# Patient Record
Sex: Female | Born: 1995 | Race: Black or African American | Hispanic: No | Marital: Single | Smoking: Never smoker
Health system: Southern US, Community
[De-identification: ages and names within clinical notes are randomized; demographics above are authoritative.]

---

## 2016-12-31 ENCOUNTER — Encounter: Payer: Self-pay | Admitting: Emergency Medicine

## 2016-12-31 ENCOUNTER — Emergency Department: Payer: Self-pay

## 2016-12-31 ENCOUNTER — Emergency Department
Admission: EM | Admit: 2016-12-31 | Discharge: 2016-12-31 | Disposition: A | Discharge status: Home / Self Care | Payer: Self-pay | Attending: Emergency Medicine | Admitting: Emergency Medicine

## 2016-12-31 DIAGNOSIS — IMO0001 Reserved for inherently not codable concepts without codable children: Secondary | ICD-10-CM

## 2016-12-31 DIAGNOSIS — Y929 Unspecified place or not applicable: Secondary | ICD-10-CM | POA: Insufficient documentation

## 2016-12-31 DIAGNOSIS — Y999 Unspecified external cause status: Secondary | ICD-10-CM | POA: Insufficient documentation

## 2016-12-31 DIAGNOSIS — W231XXA Caught, crushed, jammed, or pinched between stationary objects, initial encounter: Secondary | ICD-10-CM | POA: Insufficient documentation

## 2016-12-31 DIAGNOSIS — R52 Pain, unspecified: Secondary | ICD-10-CM

## 2016-12-31 DIAGNOSIS — S63286A Dislocation of proximal interphalangeal joint of right little finger, initial encounter: Secondary | ICD-10-CM | POA: Insufficient documentation

## 2016-12-31 DIAGNOSIS — Y9361 Activity, american tackle football: Secondary | ICD-10-CM | POA: Insufficient documentation

## 2016-12-31 MED ORDER — OXYCODONE-ACETAMINOPHEN 5-325 MG PO TABS
1.0000 | ORAL_TABLET | ORAL | Status: DC | PRN
  Administered 2016-12-31: 1 via ORAL

## 2016-12-31 MED ORDER — BUPIVACAINE HCL (PF) 0.5 % IJ SOLN
INTRAMUSCULAR | Status: AC
  Filled 2016-12-31: qty 30

## 2016-12-31 MED ORDER — OXYCODONE-ACETAMINOPHEN 5-325 MG PO TABS
ORAL_TABLET | ORAL | Status: AC
  Administered 2016-12-31: 1 via ORAL
  Filled 2016-12-31: qty 1

## 2016-12-31 NOTE — ED Provider Notes (Signed)
[facility]-EMERGENCY DEPARTMENT Provider Note   CSN: [id number] Arrival date & time: 12/31/16  1325     History   Chief Complaint Chief Complaint  Patient presents with  . Finger Injury    HPI Melissa Calhoun is a 21 y.o. female presents to the emergency department for evaluation of pain to the right fifth digit. She has a deformity to the right fifth digit after playing football and catching the ball just prior to arrival. She jammed her PIP joint of the right fifth digit. She denies any numbness. No bleeding. Pain is moderate to severe.  HPI  History reviewed. No pertinent past medical history.  There are no active problems to display for this patient.   History reviewed. No pertinent surgical history.  OB History    No data available       Home Medications    Prior to Admission medications   Not on File    Family History No family history on file.  Social History Social History  Substance Use Topics  . Smoking status: Never Smoker  . Smokeless tobacco: Never Used  . Alcohol use No     Allergies   Patient has no known allergies.   Review of Systems Review of Systems  Constitutional: Negative for activity change.  HENT: Negative for congestion.   Eyes: Negative for visual disturbance.  Respiratory: Negative for chest tightness.   Musculoskeletal: Positive for arthralgias. Negative for gait problem.  Neurological: Negative for weakness and numbness.  Psychiatric/Behavioral: Negative for agitation, behavioral problems and confusion.     Physical Exam Updated Vital Signs BP (!) 141/83 (BP Location: Left Arm)   Pulse 86   Temp 97.6 F (36.4 C) (Oral)   Resp 20   Ht 5\' 4"  (1.626 m)   Wt 70.8 kg   LMP 12/03/2016 (Exact Date)   SpO2 98%   BMI 26.78 kg/m   Physical Exam  Constitutional: She appears well-developed and well-nourished.  HENT:  Head: Normocephalic and atraumatic.  Eyes: EOM are normal.  Neck: Normal range of motion.    Cardiovascular: Intact distal pulses.   Pulmonary/Chest: Effort normal. No respiratory distress.  Musculoskeletal:  Examination of the right fifth digit shows patient has a deformity to the PIP joint. No skin breakdown noted. Sensation is intact distally.     ED Treatments / Results  Labs (all labs ordered are listed, but only abnormal results are displayed) Labs Reviewed - No data to display  EKG  EKG Interpretation None       Radiology Dg Finger Little Right  Result Date: 12/31/2016 CLINICAL DATA:  Right little finger pain and deformity following a football injury. EXAM: RIGHT LITTLE FINGER 2+V COMPARISON:  None. FINDINGS: Dorsal and lateral dislocation of the fifth middle phalanx relative to the proximal phalanx. No fracture seen. IMPRESSION: Fifth PIP joint dislocation. Electronically Signed   By: Beckie SaltsSteven  Reid M.D.   On: 12/31/2016 14:15   Imaging: Postreduction films: Postreduction films of the right fifth digit reviewed by me today in the emergency department showed no evidence of acute bony abnormality. There is proper alignment of the PIP joint.  Procedures Procedures (including critical care time) Closed reduction patient agreed and consented to a close reduction of the right fifth digit PIP joint. Gentle traction was applied with slight extension then the base of the middle phalanx was slid forward into proper alignment. Patient tolerated the procedure well. Splint and buddy tape were applied. Patient elected not to have any local anesthesia/digital block prior  to reduction  SPLINT APPLICATION Date/Time: 2:31 PM Authorized by: Patience Musca Consent: Verbal consent obtained. Risks and benefits: risks, benefits and alternatives were discussed Consent given by: patient Splint applied by: Physician Asst. Location details: Right fifth digit  Splint type: Aluminum/foam with buddy take  Supplies used: Aluminum/foam with buddy tape  Post-procedure: The  splinted body part was neurovascularly unchanged following the procedure. Patient tolerance: Patient tolerated the procedure well with no immediate complications.     Medications Ordered in ED Medications  oxyCODONE-acetaminophen (PERCOCET/ROXICET) 5-325 MG per tablet 1 tablet (1 tablet Oral Given 12/31/16 1400)  bupivacaine (MARCAINE) 0.5 % injection (not administered)     Initial Impression / Assessment and Plan / ED Course  I have reviewed the triage vital signs and the nursing notes.  Pertinent labs & imaging results that were available during my care of the patient were reviewed by me and considered in my medical decision making (see chart for details).   21 year old female with right fifth digit PIP dislocation. She's had a successful reduction in the emergency department. She was splinted and buddy taped.  Final Clinical Impressions(s) / ED Diagnoses   Final diagnoses:  Pain  Closed traumatic PIP dislocation, initial encounter    New Prescriptions New Prescriptions   No medications on file     Evon Slack, PA-C 12/31/16 1431    Jeanmarie Plant, MD 12/31/16 1504

## 2016-12-31 NOTE — Discharge Instructions (Signed)
Please continue to keep the finger splinted and buddy taped for the next 10 days. You may remove splint and tape to shower. In 10 days he may buddy tape only and discontinue splint. Follow-up with orthopedics if no improvement or continued pain. Tylenol and ibuprofen as needed for pain.

## 2016-12-31 NOTE — ED Notes (Signed)
See triage note  States she tried to catch a football  Jammed her right 5 th finger   Positive deformity

## 2016-12-31 NOTE — ED Triage Notes (Signed)
Pt presents to ED with c/o obvious R pinky deformity. Pt states was playing football and caught a ball and it bent the finger back. Pt's pinky is noted to be bent and deformed in triage.

## 2017-10-01 IMAGING — DX DG FINGER LITTLE 2+V*R*
3 series · 3 of 3 positions shown · non-contrast
Comparison: None.

CLINICAL DATA: Right little finger pain and deformity following a
football injury.

EXAM:
RIGHT LITTLE FINGER 2+V

[finger ap]
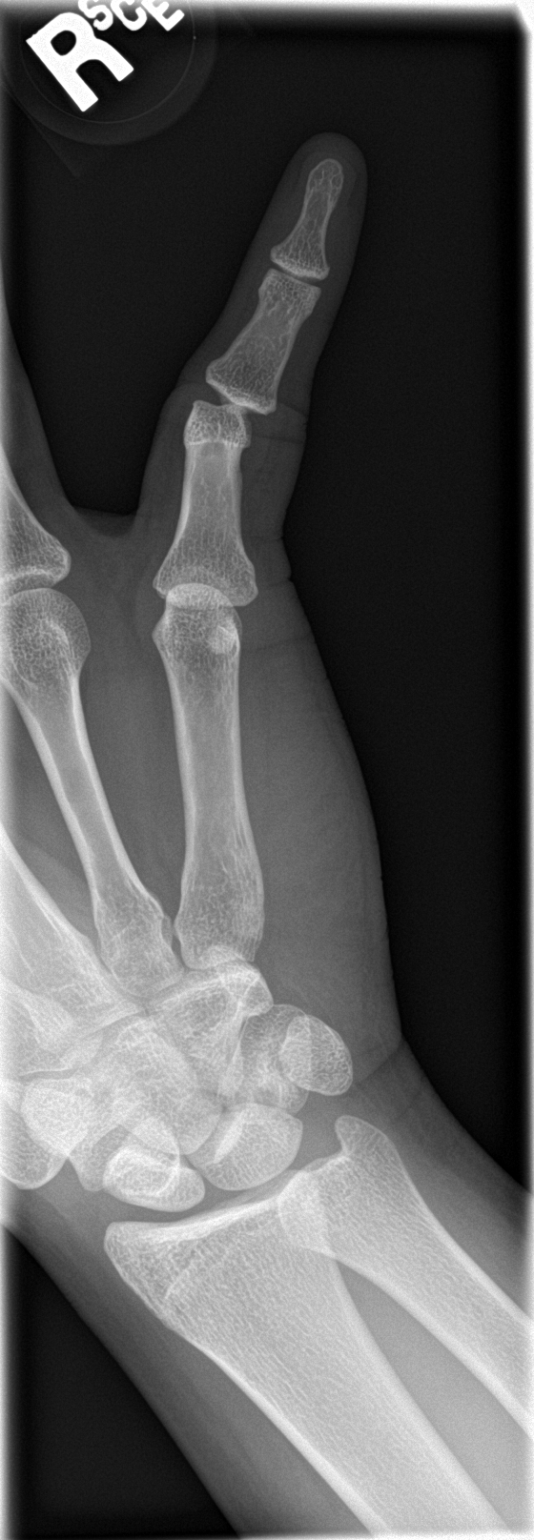

[finger obl]
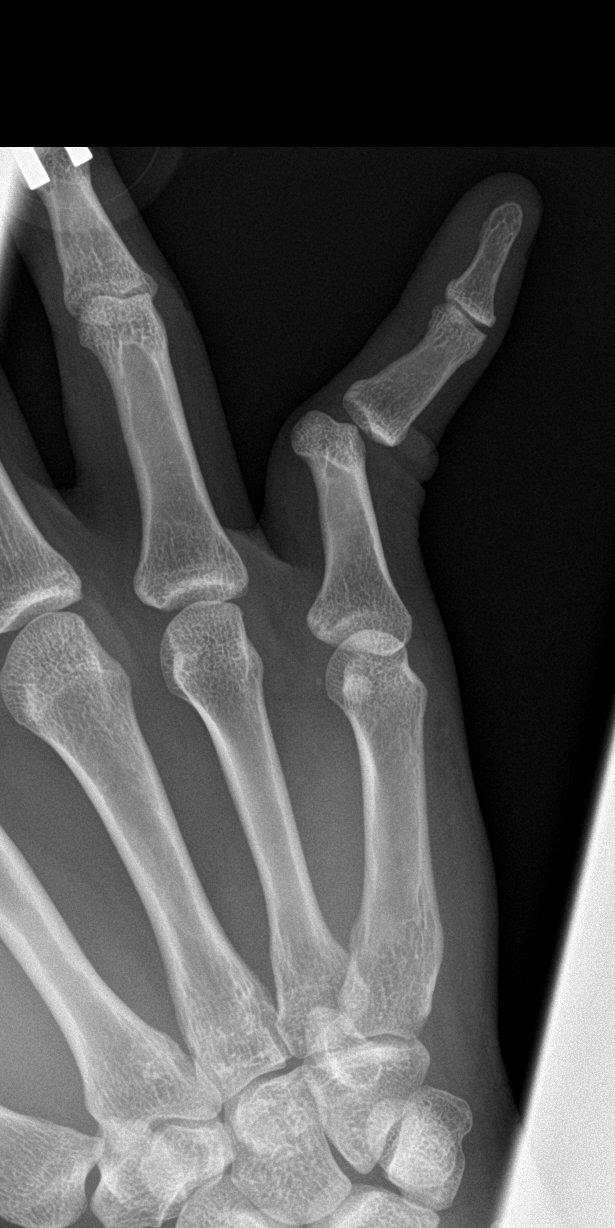

[finger lat]
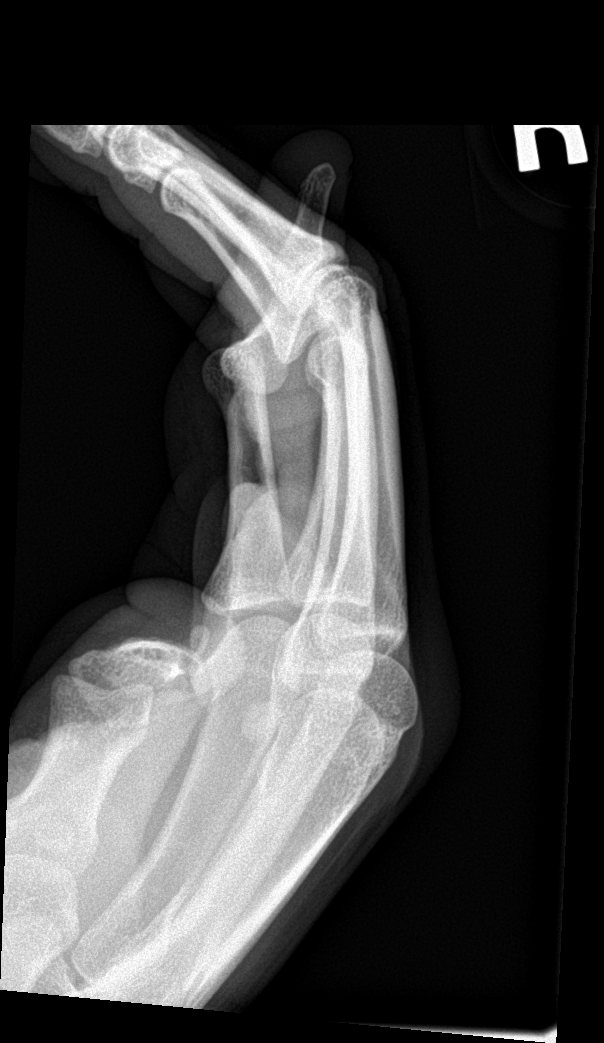

[3 of 3 positions shown; findings below may reference images not displayed]

FINDINGS: Dorsal and lateral dislocation of the fifth middle phalanx relative
to the proximal phalanx. No fracture seen.
IMPRESSION: Fifth PIP joint dislocation.
